# Patient Record
Sex: Female | Born: 1990 | Race: White | Hispanic: No | Marital: Married
Health system: Southern US, Community
[De-identification: ages and names within clinical notes are randomized; demographics above are authoritative.]

## PROBLEM LIST (undated history)

## (undated) DIAGNOSIS — F319 Bipolar disorder, unspecified: Secondary | ICD-10-CM

## (undated) DIAGNOSIS — F4310 Post-traumatic stress disorder, unspecified: Secondary | ICD-10-CM

## (undated) DIAGNOSIS — F32A Depression, unspecified: Secondary | ICD-10-CM

## (undated) DIAGNOSIS — F419 Anxiety disorder, unspecified: Secondary | ICD-10-CM

## (undated) DIAGNOSIS — E039 Hypothyroidism, unspecified: Secondary | ICD-10-CM

## (undated) HISTORY — PX: APPENDECTOMY: SHX54

---
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
## 2008-07-12 ENCOUNTER — Emergency Department (HOSPITAL_COMMUNITY): Admission: EM | Admit: 2008-07-12 | Discharge: 2008-07-12 | Payer: Self-pay | Admitting: Emergency Medicine

## 2008-07-19 ENCOUNTER — Emergency Department (HOSPITAL_COMMUNITY): Admission: EM | Admit: 2008-07-19 | Discharge: 2008-07-19 | Payer: Self-pay | Admitting: Emergency Medicine

## 2009-03-08 ENCOUNTER — Ambulatory Visit: Payer: Self-pay | Admitting: Psychiatry

## 2009-03-08 ENCOUNTER — Emergency Department (HOSPITAL_COMMUNITY): Admission: EM | Admit: 2009-03-08 | Discharge: 2009-03-08 | Payer: Self-pay | Admitting: Emergency Medicine

## 2009-03-08 ENCOUNTER — Inpatient Hospital Stay (HOSPITAL_COMMUNITY): Admission: AD | Admit: 2009-03-08 | Discharge: 2009-03-11 | Payer: Self-pay | Admitting: Psychiatry

## 2009-07-13 ENCOUNTER — Emergency Department (HOSPITAL_COMMUNITY): Admission: EM | Admit: 2009-07-13 | Discharge: 2009-07-14 | Payer: Self-pay | Admitting: Emergency Medicine

## 2010-03-09 IMAGING — CT CT ABDOMEN W/O CM
2 of 4 series · 17 of 46 positions shown, 19 images · non-contrast
Comparison: 12/15/2005.

CT ABDOMEN

CLINICAL DATA: Severe right lower quadrant pain.  History of prior
appendectomy.

CT ABDOMEN AND PELVIS WITHOUT CONTRAST
TECHNIQUE: Multidetector CT imaging of the abdomen and pelvis was
performed following the standard protocol without intravenous
contrast.

[Series 2: a/p w/o 5.0 b31f st · axial · non-contrast · 0.64mm/px · z∈[+880,+1265]mm · 14 of 85 slices shown, 16 images]
[im 4/85  soft-tissue]
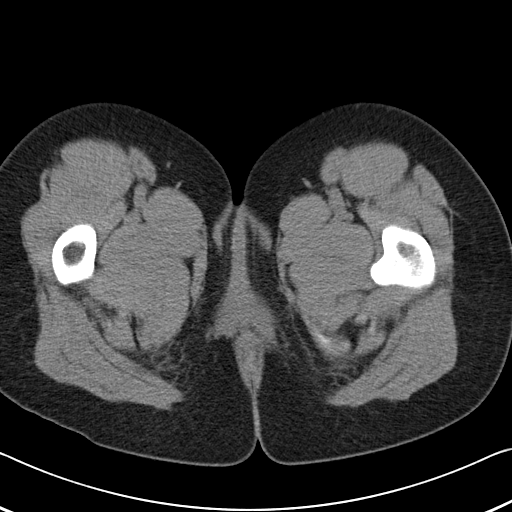
[im 4/85  bone]
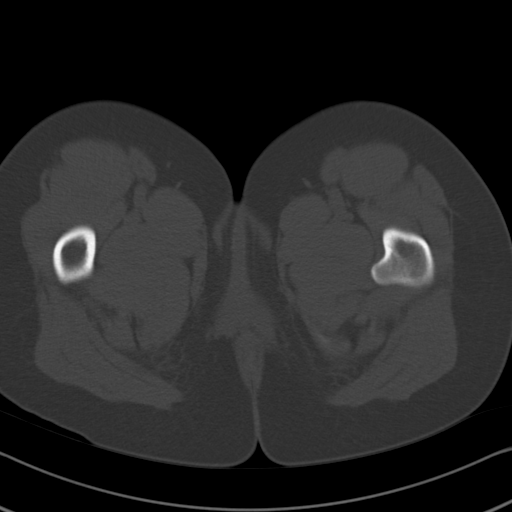
[im 10/85  soft-tissue]
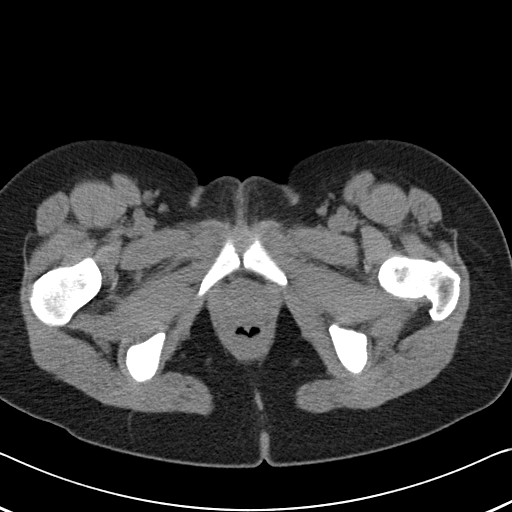
[im 17/85  soft-tissue]
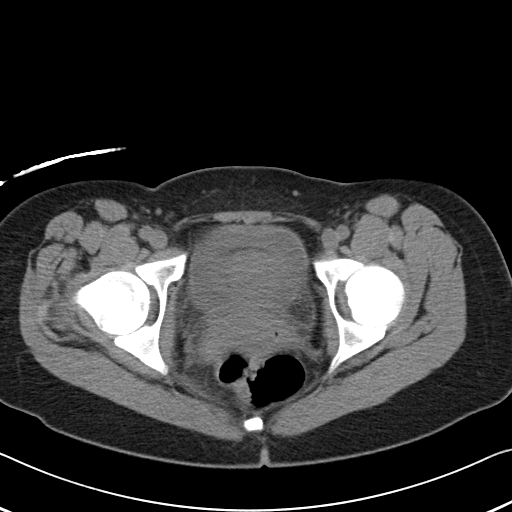
[im 23/85  soft-tissue]
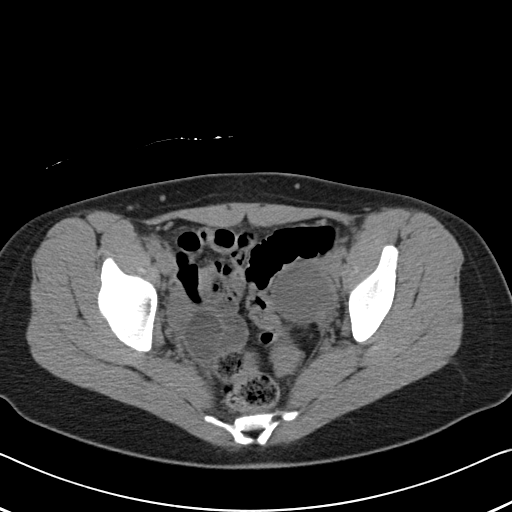
[im 30/85  soft-tissue]
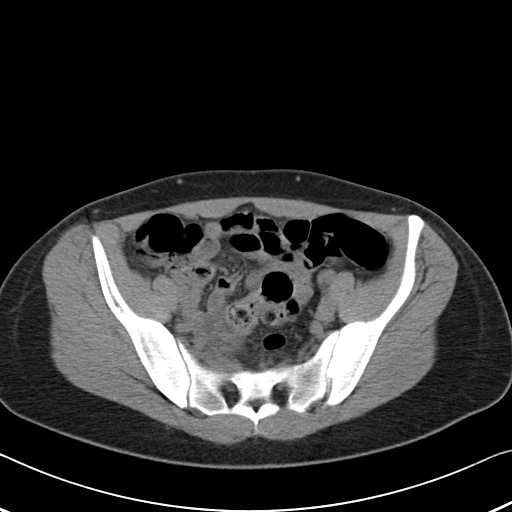
[im 33/85  soft-tissue]
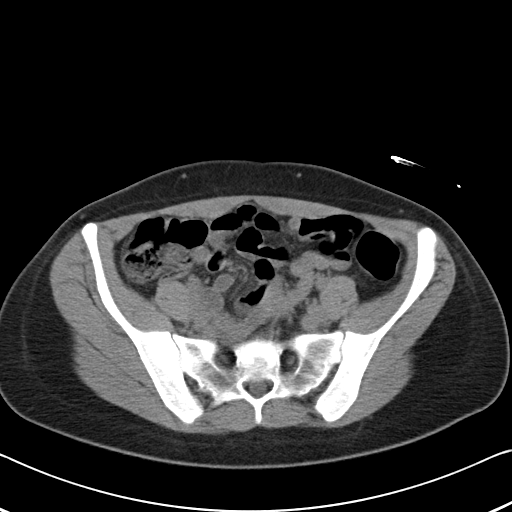
[im 39/85  soft-tissue]
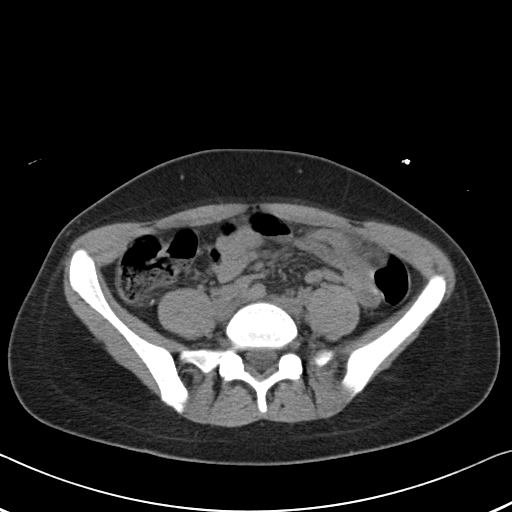
[im 46/85  soft-tissue]
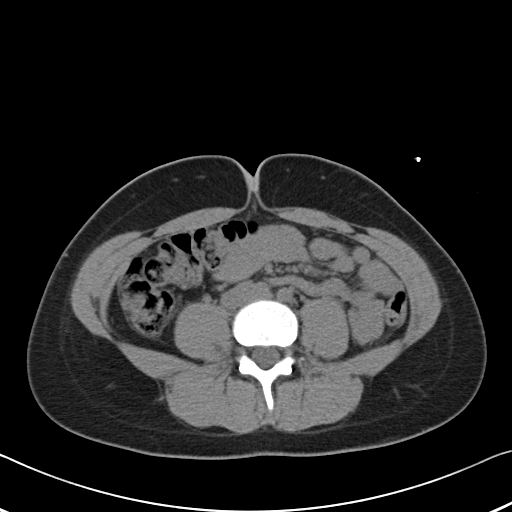
[im 52/85  soft-tissue]
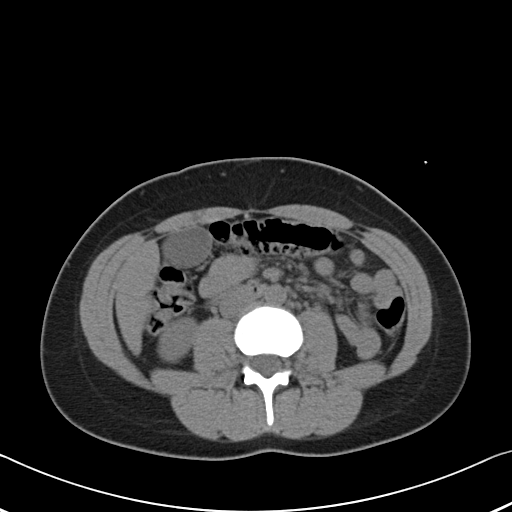
[im 52/85  bone]
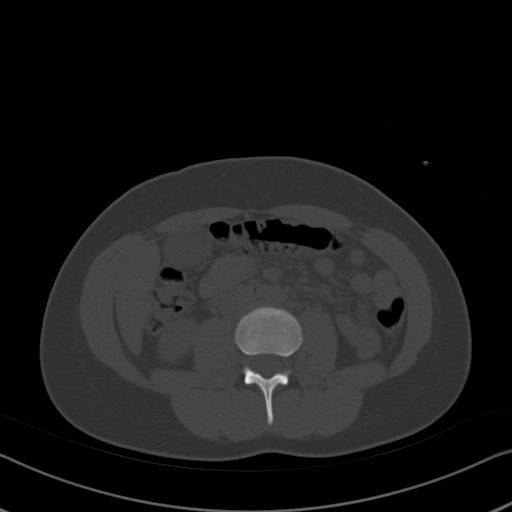
[im 55/85  soft-tissue]
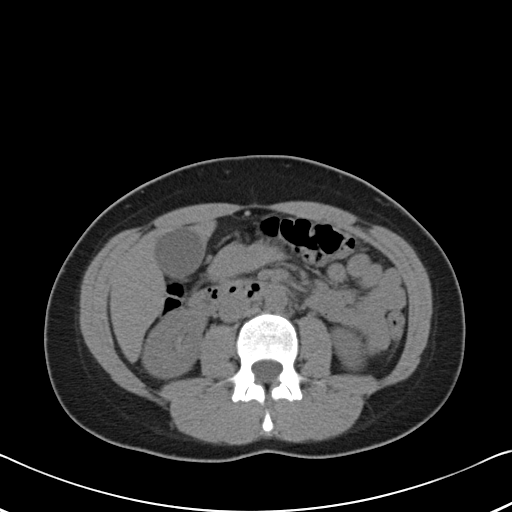
[im 62/85  soft-tissue]
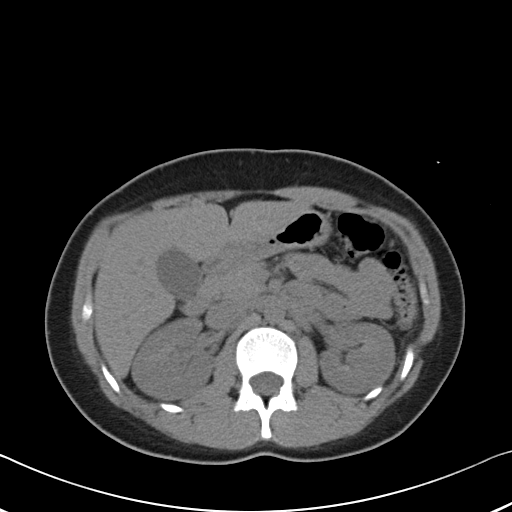
[im 68/85  soft-tissue]
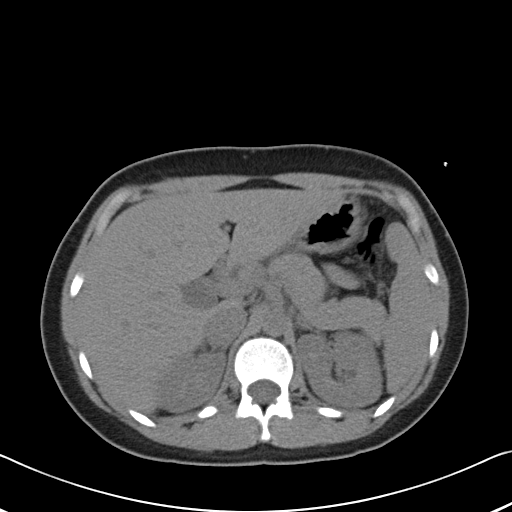
[im 75/85  soft-tissue]
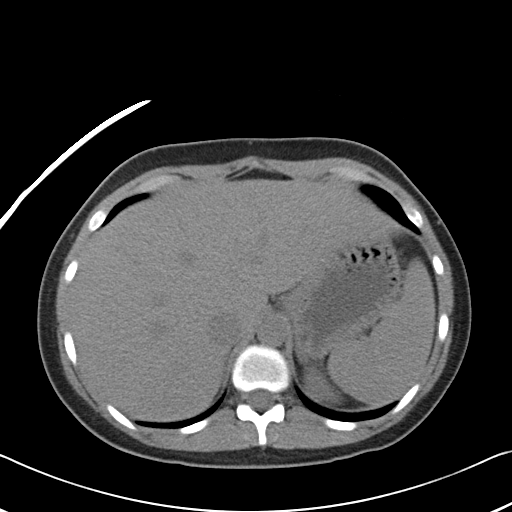
[im 81/85  soft-tissue]
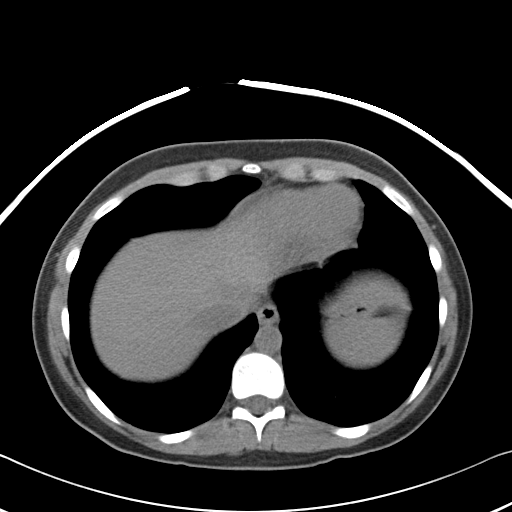

[Series 602: coronal · coronal · 0.83mm/px · 3 of 62 slices shown]
[im 21/62  soft-tissue]
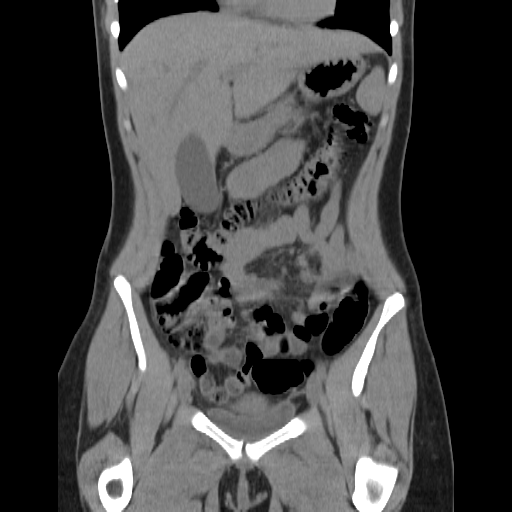
[im 28/62  soft-tissue]
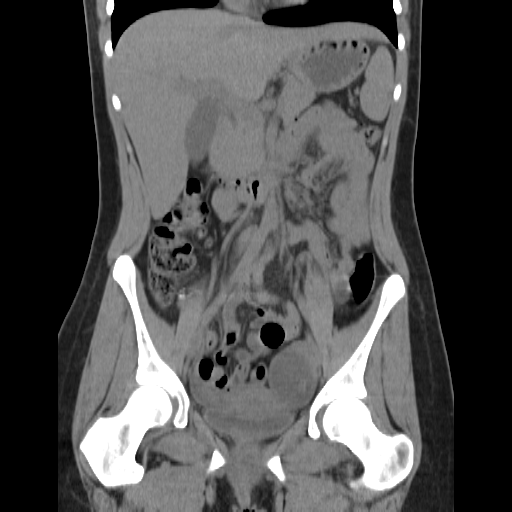
[im 34/62  soft-tissue]
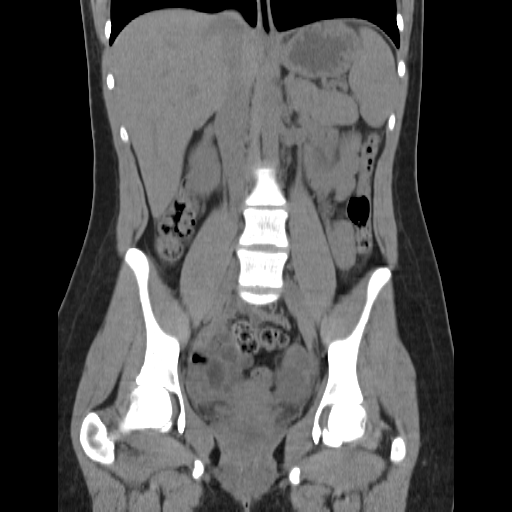

[17 of 46 positions shown; findings below may reference images not displayed]

FINDINGS: The liver and spleen have normal uninfused appearance.
The stomach, duodenum, pancreas, gallbladder, adrenal glands, and
kidneys have normal imaging features.  Specifically, there is no
evidence for renal calculi.  No secondary changes seen in either
kidney.

No abdominal lymphadenopathy.  No intraperitoneal free fluid.
IMPRESSION: Unremarkable CT scan of the abdomen without contrast.

CT PELVIS
FINDINGS: There is no evidence for intraperitoneal free fluid.
The bladder is unremarkable.  No pelvic sidewall lymphadenopathy.
Terminal ileum is normal.  Nonvisualization of the appendix is
consistent with the reported history of prior appendectomy.

4.0 x 4.2 cm cystic process is identified in the left adnexal
space.  In the right adnexal space, there is a serpiginous fluid
filled structure.  This could possibly be fluid filled small bowel,
but I cannot identify other small bowel loops with this amount of
fluid.  A dilated right fallopian tube would be another
consideration, especially in light of the patient's right lower
quadrant pain.  The uterus is unremarkable by CT.

Bone windows reveal no worrisome lytic or sclerotic osseous
lesions.
IMPRESSION: 4.2 cm cystic process in the left adnexal space is likely related
to the left ovary.  Pelvic ultrasound would be helpful to further
evaluate.

Fluid-filled serpiginous structure in the right adnexal space is
felt to be more likely related to the fallopian tube than small
bowel, as described above.  Hydrosalpinx or pyosalpinx could have
this appearance.  Pelvic ultrasound may be confirmatory.

## 2010-03-09 IMAGING — US US PELVIS COMPLETE MODIFY
1 series · 13 of 25 positions shown · non-contrast
Comparison: CT scan from earlier today.

CLINICAL DATA: CT scan earlier today showed a complex left adnexal
cystic lesion with possible right hydrosalpinx.

TRANSABDOMINAL AND TRANSVAGINAL ULTRASOUND OF PELVIS
TECHNIQUE: Both transabdominal and transvaginal ultrasound
examinations of the pelvis were performed including evaluation of
the uterus, ovaries, adnexal regions, and pelvic cul-de-sac.

[Series 1: us pelvis complete modify · 0.22mm/px · 13 of 87 slices shown]
[im 1/87]
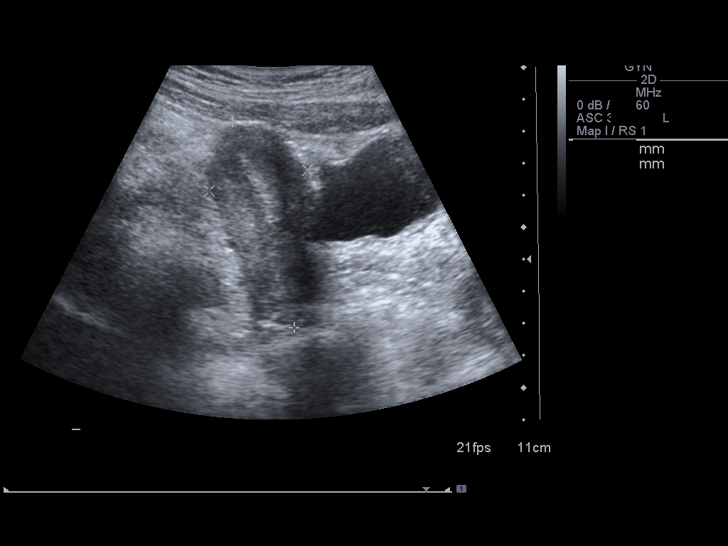
[im 8/87]
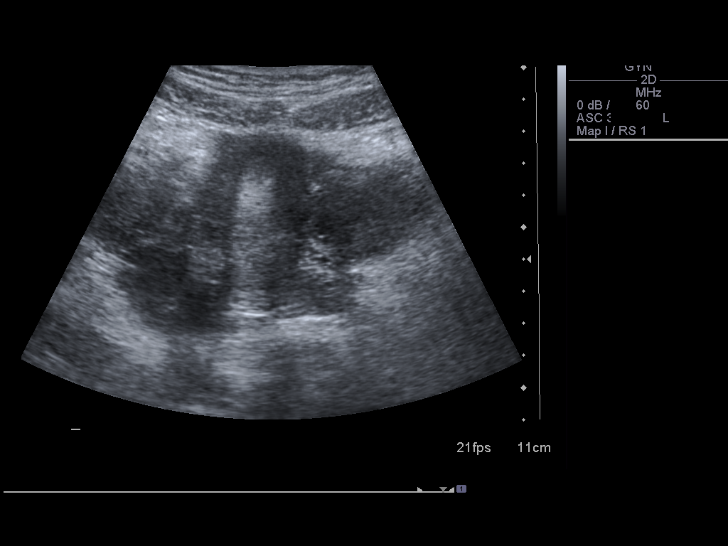
[im 15/87]
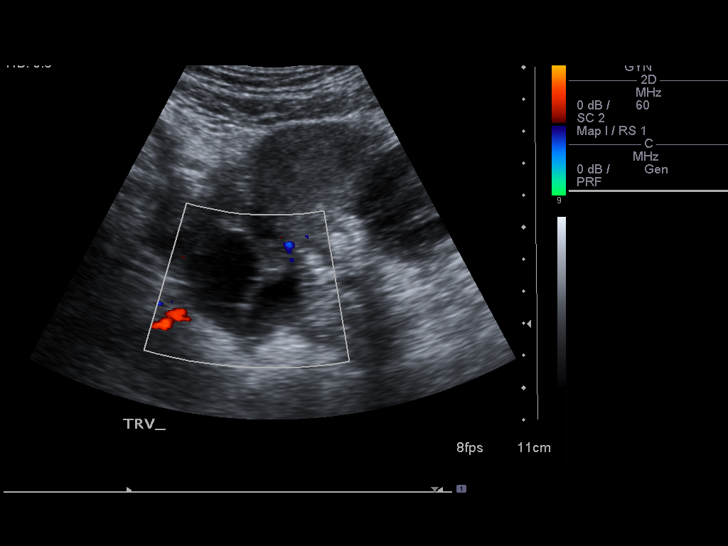
[im 22/87]
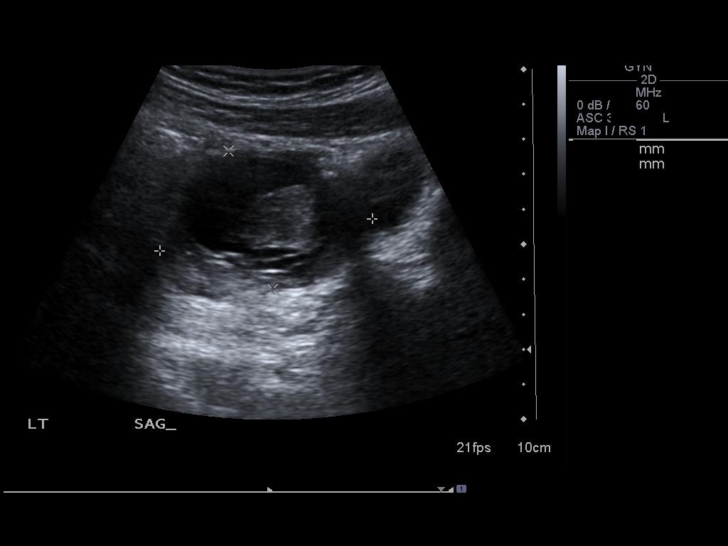
[im 29/87]
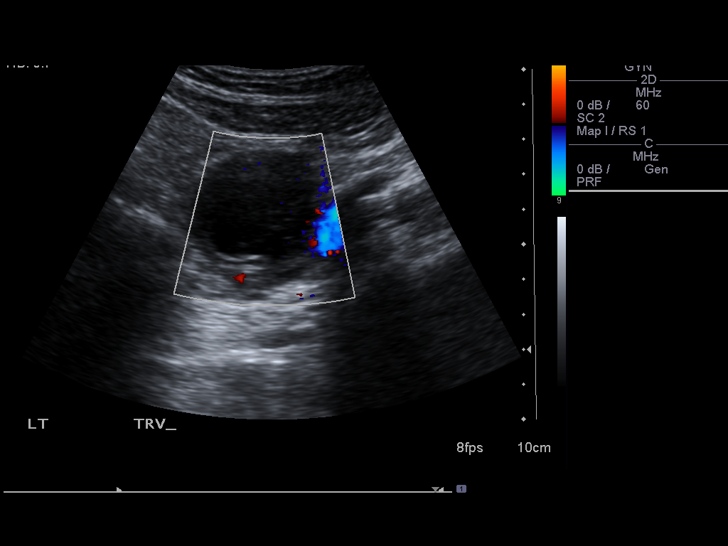
[im 36/87]
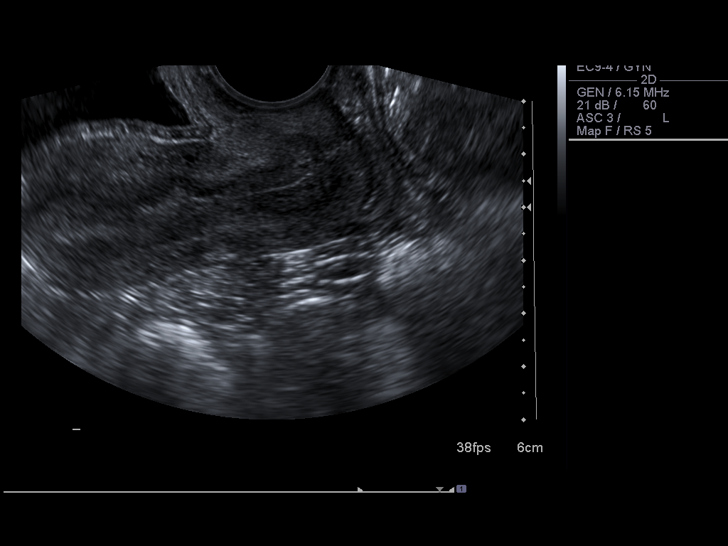
[im 44/87]
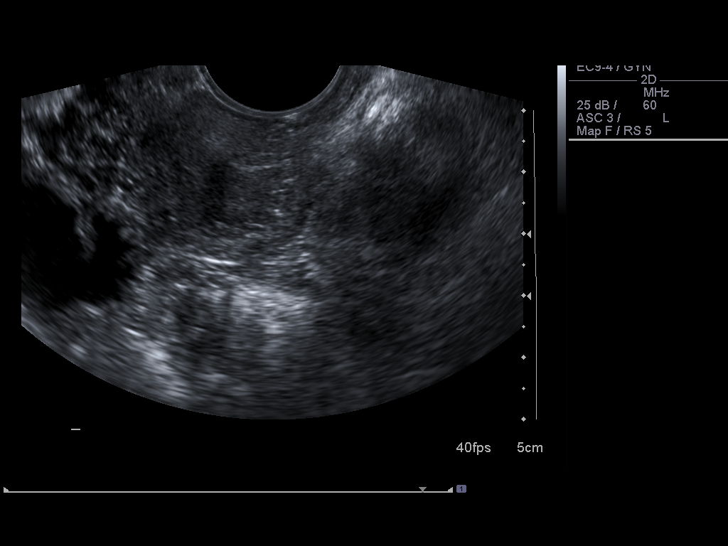
[im 51/87]
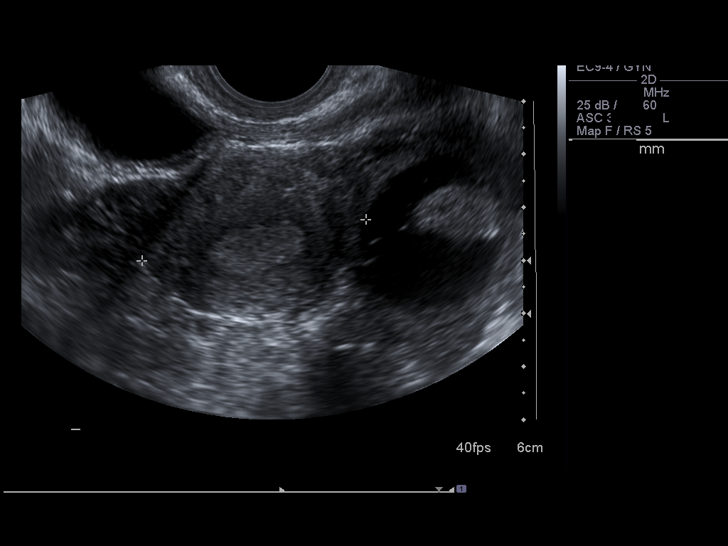
[im 58/87]
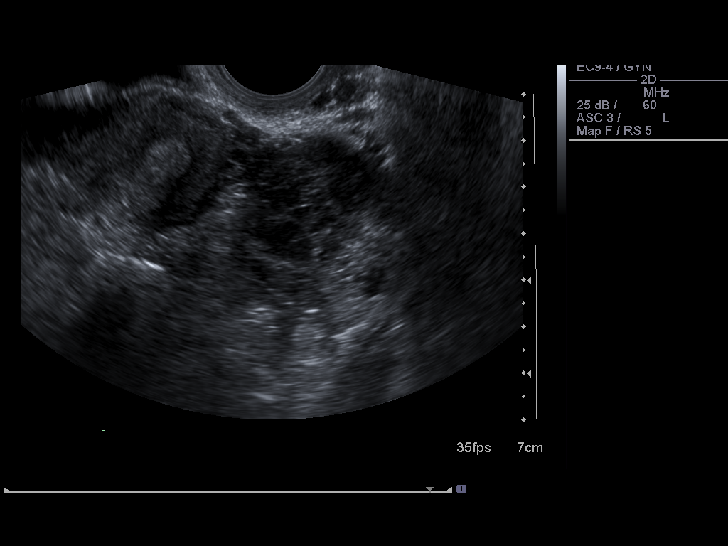
[im 65/87]
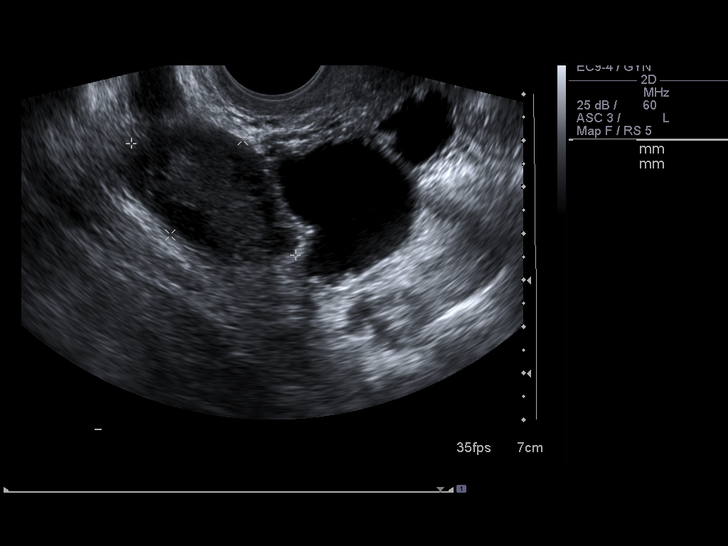
[im 72/87]
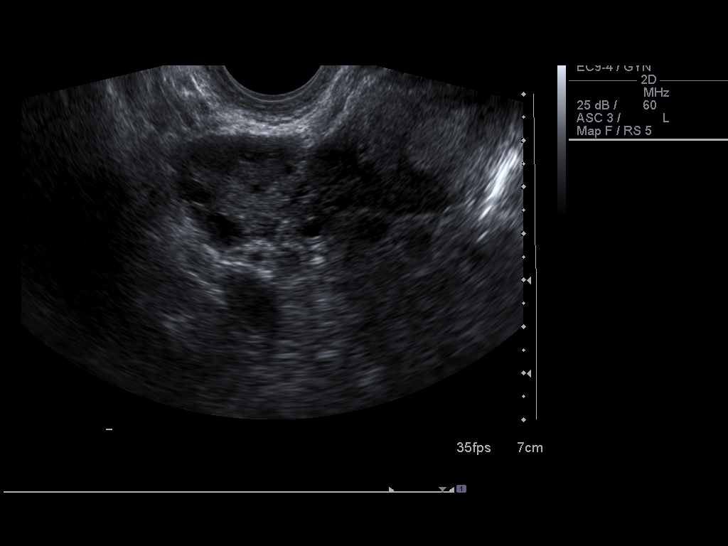
[im 79/87]
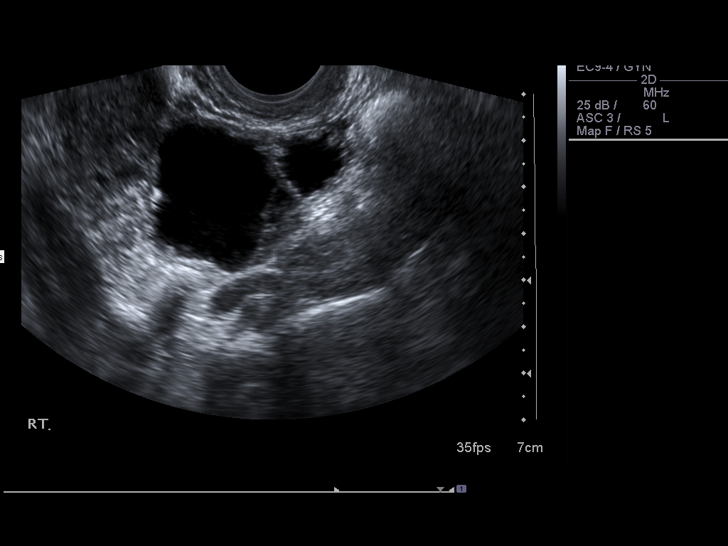
[im 87/87]
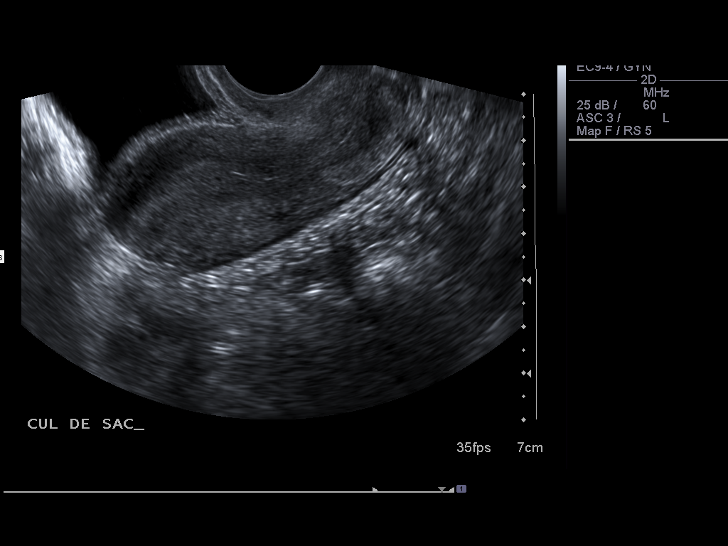

[13 of 25 positions shown; findings below may reference images not displayed]

FINDINGS: The uterus measures 6.7 x 3.1 x 4.4 cm.  Myometrial echotexture is
homogeneous.  No evidence for fibroids.  Endometrial stripe
thickness is
9. mm.

The right ovary measures 4.3 x 2.6 x 2.8 cm.  The left ovary is not
identified.  The patient has a complex 4.2 x 3.5 x 3.8 cm cystic
mass in the left adnexal space.

In the right adnexal space is a fluid filled tubular structure.
This could be a fluid-filled small bowel loop or fallopian tube.
The fluid within this structure is simple.
IMPRESSION: Complex cystic and solid lesion in the left adnexal space is
probably a hemorrhagic cyst in the left ovary.  Follow up
ultrasound in 6 weeks is recommended to reevaluate.

Normal right ovary with an adjacent fluid-filled tubular structure.
This is probably a dilated fallopian tube consistent with
hydrosalpinx.  I cannot completely excluded, however, that this
represents a focal, mildly distended small bowel loop.  This area
could also be reassessed at the time of follow-up ultrasound.

## 2011-02-13 LAB — COMPREHENSIVE METABOLIC PANEL
ALT: 19 U/L (ref 0–35)
AST: 19 U/L (ref 0–37)
Albumin: 3.7 g/dL (ref 3.5–5.2)
Alkaline Phosphatase: 45 U/L (ref 39–117)
BUN: 7 mg/dL (ref 6–23)
CO2: 23 mEq/L (ref 19–32)
Calcium: 8.6 mg/dL (ref 8.4–10.5)
Chloride: 111 mEq/L (ref 96–112)
Creatinine, Ser: 0.45 mg/dL (ref 0.4–1.2)
GFR calc Af Amer: >60 mL/min (ref >60)
GFR calc non Af Amer: >60 mL/min (ref >60)
Glucose, Bld: 89 mg/dL (ref 70–99)
Potassium: 3.4 mEq/L — ABNORMAL LOW (ref 3.5–5.1)
Sodium: 141 mEq/L (ref 135–145)
Total Bilirubin: 0.7 mg/dL (ref 0.3–1.2)
Total Protein: 6.4 g/dL (ref 6.0–8.3)

## 2011-02-13 LAB — URINALYSIS, ROUTINE W REFLEX MICROSCOPIC
Bilirubin Urine: NEGATIVE
Glucose, UA: NEGATIVE mg/dL
Ketones, ur: NEGATIVE mg/dL
Leukocytes, UA: NEGATIVE
Nitrite: NEGATIVE
Protein, ur: NEGATIVE mg/dL
Specific Gravity, Urine: 1.016 (ref 1.005–1.030)
Urobilinogen, UA: 0.2 mg/dL (ref 0.0–1.0)
pH: 6.5 (ref 5.0–8.0)

## 2011-02-13 LAB — GC/CHLAMYDIA PROBE AMP, GENITAL
Chlamydia, DNA Probe: NEGATIVE
GC Probe Amp, Genital: NEGATIVE

## 2011-02-13 LAB — CBC
HCT: 34 % — ABNORMAL LOW (ref 36.0–46.0)
Hemoglobin: 11.8 g/dL — ABNORMAL LOW (ref 12.0–15.0)
MCHC: 34.7 g/dL (ref 30.0–36.0)
MCV: 90.7 fL (ref 78.0–100.0)
Platelets: 232 10*3/uL (ref 150–400)
RBC: 3.75 MIL/uL — ABNORMAL LOW (ref 3.87–5.11)
RDW: 13 % (ref 11.5–15.5)
WBC: 9.6 10*3/uL (ref 4.0–10.5)

## 2011-02-13 LAB — WET PREP, GENITAL
Trich, Wet Prep: NONE SEEN
Yeast Wet Prep HPF POC: NONE SEEN

## 2011-02-13 LAB — DIFFERENTIAL
Basophils Absolute: 0 10*3/uL (ref 0.0–0.1)
Basophils Relative: 1 % (ref 0–1)
Eosinophils Absolute: 0.3 10*3/uL (ref 0.0–0.7)
Eosinophils Relative: 3 % (ref 0–5)
Lymphocytes Relative: 35 % (ref 12–46)
Lymphs Abs: 3.4 10*3/uL (ref 0.7–4.0)
Monocytes Absolute: 0.6 10*3/uL (ref 0.1–1.0)
Monocytes Relative: 7 % (ref 3–12)
Neutro Abs: 5.3 10*3/uL (ref 1.7–7.7)
Neutrophils Relative %: 55 % (ref 43–77)

## 2011-02-13 LAB — URINE MICROSCOPIC-ADD ON

## 2011-02-13 LAB — PREGNANCY, URINE: Preg Test, Ur: NEGATIVE

## 2011-02-18 LAB — CBC
HCT: 39.3 % (ref 36.0–46.0)
Hemoglobin: 13.6 g/dL (ref 12.0–15.0)
MCHC: 34.6 g/dL (ref 30.0–36.0)
MCV: 86.3 fL (ref 78.0–100.0)
Platelets: 214 10*3/uL (ref 150–400)
RBC: 4.56 MIL/uL (ref 3.87–5.11)
RDW: 13.1 % (ref 11.5–15.5)
WBC: 8.1 10*3/uL (ref 4.0–10.5)

## 2011-02-18 LAB — URINALYSIS, ROUTINE W REFLEX MICROSCOPIC
Glucose, UA: NEGATIVE mg/dL
Hgb urine dipstick: NEGATIVE
Ketones, ur: >80 mg/dL — AB
Nitrite: NEGATIVE
Protein, ur: 30 mg/dL — AB
Specific Gravity, Urine: 1.029 (ref 1.005–1.030)
Urobilinogen, UA: 1 mg/dL (ref 0.0–1.0)
pH: 6 (ref 5.0–8.0)

## 2011-02-18 LAB — POCT I-STAT, CHEM 8
BUN: 12 mg/dL (ref 6–23)
Calcium, Ion: 1.17 mmol/L (ref 1.12–1.32)
Chloride: 101 mEq/L (ref 96–112)
Creatinine, Ser: 0.8 mg/dL (ref 0.4–1.2)
Glucose, Bld: 119 mg/dL — ABNORMAL HIGH (ref 70–99)
HCT: 42 % (ref 36.0–46.0)
Hemoglobin: 14.3 g/dL (ref 12.0–15.0)
Potassium: 3.1 mEq/L — ABNORMAL LOW (ref 3.5–5.1)
Sodium: 137 mEq/L (ref 135–145)
TCO2: 24 mmol/L (ref 0–100)

## 2011-02-18 LAB — URINE CULTURE
Colony Count: NO GROWTH
Culture: NO GROWTH

## 2011-02-18 LAB — DIFFERENTIAL
Basophils Absolute: 0 10*3/uL (ref 0.0–0.1)
Basophils Relative: 1 % (ref 0–1)
Eosinophils Absolute: 0.2 10*3/uL (ref 0.0–0.7)
Eosinophils Relative: 2 % (ref 0–5)
Lymphocytes Relative: 44 % (ref 12–46)
Lymphs Abs: 3.6 10*3/uL (ref 0.7–4.0)
Monocytes Absolute: 0.7 10*3/uL (ref 0.1–1.0)
Monocytes Relative: 9 % (ref 3–12)
Neutro Abs: 3.6 10*3/uL (ref 1.7–7.7)
Neutrophils Relative %: 45 % (ref 43–77)

## 2011-02-18 LAB — RAPID URINE DRUG SCREEN, HOSP PERFORMED
Amphetamines: NOT DETECTED
Barbiturates: NOT DETECTED
Benzodiazepines: POSITIVE — AB
Cocaine: POSITIVE — AB
Opiates: NOT DETECTED
Tetrahydrocannabinol: POSITIVE — AB

## 2011-02-18 LAB — URINE MICROSCOPIC-ADD ON

## 2011-02-18 LAB — POCT PREGNANCY, URINE: Preg Test, Ur: NEGATIVE

## 2011-02-18 LAB — ETHANOL: Alcohol, Ethyl (B): <5 mg/dL (ref 0–10)
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
## 2011-03-27 NOTE — Op Note (Signed)
NAMEBRITTEN, Gina [name]             ACCOUNT NO.:  [id number]   MEDICAL RECORD NO.:  [id number]          PATIENT TYPE:  INP   LOCATION:  6116                         FACILITY:  [facility]   PHYSICIAN:  Leonia Corona, M.D.  DATE OF BIRTH:  05-Jul-1991   DATE OF PROCEDURE:  10/09/2005  DATE OF DISCHARGE:                                 OPERATIVE REPORT   PREOPERATIVE DIAGNOSIS:  Resolved appendicular abscess for interval  appendectomy.   POSTOPERATIVE DIAGNOSIS:  Resolved appendicular abscess for interval  appendectomy.   PROCEDURE PERFORMED:  Interval laparoscopic appendectomy.   ANESTHESIA:  General endotracheal anesthesia.   SURGEON:  Leonia Corona, M.D.   ASSISTANTDonnella Bi D. Pendse, M.D.   ANESTHESIA:  General endotracheal anesthesia.   INDICATIONS FOR PROCEDURE:  This 20 year old female child was evaluated for  severe abdominal pain about eight weeks ago in the emergency room diagnosed  as appendicular abscess forming appendicular mass in the right lower  quadrant, clinically treated conservatively nonoperatively using IV  antibiotics for three weeks.  She responded very well and now the patient is  brought in for interval laparoscopic appendectomy, hence the indication for  the procedure.   PROCEDURE IN DETAIL:  The patient is brought in the operating room, placed  supine on the operating table, general endotracheal anesthesia is given.  A  Foley catheter was placed to deflate the bladder temporarily for the  procedure.  The abdomen was cleaned, prepped and draped from nipples to  pelvis and from one side to the other side.  The initial incision was made  infraumbilically in a curvilinear fashion for the placement of a 10/12  Hasson cannula.  The incision was made with the knife, deepened through the  subcutaneous tissue using a hemostat until the fascia was reached.  The  fascia was grasped with two clamps and incised inbetween.  An opening into  the fascia was made  and a right index finger was introduced into the  peritoneal cavity to sweep it around to break any adhesions.  After getting  clear entry into the peritoneal cavity, a stay suture using 0 Vicryl was  placed on the divided fascia.  The Hasson cannula was introduced into the  peritoneal cavity under direct vision and the 0 Vicryl was wrapped around it  to hold it in position.  The CO2 insufflation was done to the desired  pressure.  The camera was introduced through this port to inspect the  peritoneal cavity.  No obvious abnormality was noted.  The right lower  quadrant showed the cecum and the appendix.  We put the second cannula in  the right upper quadrant for which a 5 mm incision was made and a 5 mm port  was introduced under direct vision of the camera from within the peritoneal  cavity.  The third port was in the left lower quadrant under direct vision  of the camera from which a 5 mm incision was made and a 5 mm port was placed  under direct vision of the camera from within the peritoneal cavity.  Working through these 5 mm ports, we introduced  a grasper through the right  upper quadrant port and grasped the appendix.  The patient was placed in the  Trendelenburg position and tiled to the left side to expose the contents of  the right lower quadrant.  The appendix was held up with a grasper upward.  The mesoappendix was thin and fibrous, it was taken down without any major  vascular attachments.  Working through the left lower quadrant port and with  the help of a dissector, we were able to create a window between the base of  the appendix and the appendicular mesentery carrying the blood vessels.  At  this point, after separating the vascular attachments and the appendicular  base of the appendix, the 5 mm camera was used which was introduced through  the left lower quadrant port and the GIA stapler through the umbilical port  was introduced and the vascular pedicle was clamped,  after insuring that it  was not catching any bowel, it was fired and the vascular pedicle was  divided.  At this point, we reloaded the 10 mm GIA stapler which was  reintroduced through the umbilical port and the base of the appendix was  grasped and the stapler was fired which divided the appendix.  The appendix  was placed in the peritoneal cavity.  The peritoneal cavity was inspected.  At this point, an endobag was introduced through the umbilical port, the  appendix divided and separated, the appendix was fed into the bag, the bag  was closed and removed through the umbilical port along with the port.  After removing the bag with the appendix, the umbilical port was  reintroduced, the CO2 insufflation was recreated, and the abdominal cavity  was reinspected.  There was some oozing from the staple line on the vascular  pedicle which was observed after thorough irrigation with normal saline  which was suctioned out until it was clear.  We waited about 10-15 minutes  of observation on the staple line and felt no necessity to cauterize or  clamp these blood vessels, there was small oozing which gradually slowed  down and finally stopped.  After irrigating the right lower quadrant, we  irrigated the pelvic organ, both the tubes and ovaries appeared normal, the  uterus appeared normal, all fluid was suctioned out from the peritoneal  cavity, completely.  The patient was placed in a neutral position and the  ports were withdrawn under direct vision of the camera to watch the  abdominal wall for any bleeding inside.  No bleeding was noted.  The ports  were removed.  The stay sutures in the umbilical port were then tied which  closed the fascia completely without any additional stitch.  Before tying  this stitch, complete evacuation of CO2 was done to deflate the abdomen.  The 5 mm ports were closed only at the skin using 5-0 Vicryl subcuticular stitch.  The umbilical incision skin was also  closed with 5-0 Vicryl  subcuticular stitch.  The wound was cleaned and dried.  Steri-Strips were  applied over the incision and covered with sterile gauze and Tegaderm  dressing.  The patient tolerated the procedure very well which was smooth  and uneventful.  The patient was later extubated and transferred to the  recovery room in good, stable condition.      Leonia Corona, M.D.  Electronically Signed     SF/MEDQ  D:  10/09/2005  T:  10/09/2005  Job:  [id number]

## 2011-03-27 NOTE — Discharge Summary (Signed)
NAMECHERICE, GLENNIE             ACCOUNT NO.:  [id number]   MEDICAL RECORD NO.:  [id number]          PATIENT TYPE:  INP   LOCATION:  6116                         FACILITY:  [facility]   PHYSICIAN:  Leonia Corona, M.D.  DATE OF BIRTH:  1991/04/29   DATE OF ADMISSION:  10/09/2005  DATE OF DISCHARGE:  10/10/2005                                 DISCHARGE SUMMARY   DISCHARGE DIAGNOSES:  Scheduled status post laparoscopic appendectomy.   DISCHARGE MEDICATIONS:  Percocet 5/325, take one tablet p.o. q.6h. for pain,  #10.   HISTORY OF PRESENT ILLNESS:  The patient is a 20 year old white female who  had a history of abdominal pain since September.  A workup was performed by  her primary M.D., with continued abdominal pain.  A CT was obtained which  revealed an appendicular abscess without perforation.  The patient was  placed on IV antibiotics x10 days and then p.o. antibiotics x10 days.  The  patient continued to have abdominal pain, and surgery was scheduled for a  laparoscopic appendectomy on October 09, 2005.  The patient had a resolved  appendicular abscess.   HOSPITAL COURSE:  The patient had a laparoscopic appendectomy performed on  October 09, 2005 and was then transferred to the pediatric inpatient  service.  The patient tolerated the procedure well.  She advanced her diet,  and her pain was well-controlled on IV morphine and then changed to Percocet  5/325 q.6h. p.r.n. pain.  The patient was stable and ready for discharge  home on October 10, 2005.   FINAL DIAGNOSIS:  Resolved appendicular abscess status post laparoscopic  appendectomy.   FOLLOW UP:  The patient is to follow up with Dr. Leeanne Mannan as scheduled in 10  days on October 20, 2005 at 2:15 p.m.   DISCHARGE DATA:  Discharge weight 48 kg.   CONDITION ON DISCHARGE:  Stable.   PRIMARY CARE PHYSICIAN:  Elon Jester, M.D. at [facility].     ______________________________  Pediatrics Resident      Leonia Corona, M.D.  Electronically Signed    PR/MEDQ  D:  10/10/2005  T:  10/11/2005  Job:  [id number]   cc:   Leonia Corona, M.D.  Fax: [phone]   Elon Jester, M.D.  Fax: [phone]

## 2011-03-27 NOTE — Discharge Summary (Signed)
[name], [name]             ACCOUNT NO.:  [id number]   MEDICAL RECORD NO.:  [id number]          PATIENT TYPE:  OIB   LOCATION:  6124                         FACILITY:  [facility]   PHYSICIAN:  Pediatrics Resident    DATE OF BIRTH:  Dec 09, 1990   DATE OF ADMISSION:  12/15/2005  DATE OF DISCHARGE:  12/16/2005                                 DISCHARGE SUMMARY   HOSPITAL COURSE:  Patient admitted on December 15, 2005, for two week  history of abdominal pain.  She does have a history of a periappendicular  abscess treated with three weeks of IV antibiotics and subsequent  laparoscopic appendectomy.  Pain resolved following appendectomy but  returned two weeks ago.  Patient was admitted by surgery for further work-up  of abdominal pain.  CT of the abdomen showed no acute or active inflammatory  process.  Chest x-ray was normal.  Pediatric surgery felt it was no longer a  surgical issue and she was transferred to the pediatrics teaching teams.  Labs were concerning for a low albumin of 2.8, although all other labs were  normal.  She received a HIV, hepatitis C, hepatitis B tests which were found  to be normal.  She received a pelvic exam and cultures were sent for GC and  Chlamydia.  She did have cervical motion tenderness and was treated with  doxycycline and cefixime one dose of each for possible PID.  GC and  Chlamydia cultures returned as positive.   DIAGNOSIS:  Pelvic inflammatory disease.   MEDICATIONS:  None.   CONDITION ON DISCHARGE:  Improved and stable.   DISCHARGE INSTRUCTIONS:  Follow up with Dr. Carmon Ginsberg at [facility]  on Friday, December 18, 2005, at 1 p.m.  Dr. Carmon Ginsberg has been contacted and  made aware of the recent lab results of positive Chlamydia and GC culture.           ______________________________  Pediatrics Resident     PR/MEDQ  D:  12/17/2005  T:  12/18/2005  Job:  [id number]
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
## 2021-06-23 ENCOUNTER — Ambulatory Visit (INDEPENDENT_AMBULATORY_CARE_PROVIDER_SITE_OTHER): Payer: No Payment, Other | Admitting: Clinical

## 2021-06-23 ENCOUNTER — Other Ambulatory Visit: Payer: Self-pay

## 2021-06-23 DIAGNOSIS — F1121 Opioid dependence, in remission: Secondary | ICD-10-CM

## 2021-06-23 DIAGNOSIS — F1521 Other stimulant dependence, in remission: Secondary | ICD-10-CM

## 2021-06-23 DIAGNOSIS — F1421 Cocaine dependence, in remission: Secondary | ICD-10-CM

## 2021-06-23 DIAGNOSIS — F331 Major depressive disorder, recurrent, moderate: Secondary | ICD-10-CM | POA: Diagnosis not present
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
## 2021-07-18 ENCOUNTER — Ambulatory Visit (HOSPITAL_COMMUNITY): Payer: No Payment, Other | Admitting: Student in an Organized Health Care Education/Training Program

## 2021-07-25 ENCOUNTER — Ambulatory Visit (HOSPITAL_COMMUNITY): Payer: No Payment, Other | Admitting: Physician Assistant

## 2021-08-12 ENCOUNTER — Ambulatory Visit (HOSPITAL_COMMUNITY): Payer: Self-pay | Admitting: Clinical

## 2021-08-21 ENCOUNTER — Ambulatory Visit (HOSPITAL_COMMUNITY): Payer: Self-pay | Admitting: Physician Assistant

## 2021-09-02 ENCOUNTER — Ambulatory Visit (HOSPITAL_COMMUNITY): Payer: Self-pay | Admitting: Clinical

## 2021-09-02 ENCOUNTER — Other Ambulatory Visit: Payer: Self-pay

## 2021-09-02 ENCOUNTER — Ambulatory Visit (INDEPENDENT_AMBULATORY_CARE_PROVIDER_SITE_OTHER): Payer: No Payment, Other | Admitting: Clinical

## 2021-09-02 DIAGNOSIS — F331 Major depressive disorder, recurrent, moderate: Secondary | ICD-10-CM
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
## 2021-10-13 ENCOUNTER — Ambulatory Visit (HOSPITAL_COMMUNITY): Payer: No Payment, Other | Admitting: Clinical

## 2021-10-30 ENCOUNTER — Ambulatory Visit (HOSPITAL_COMMUNITY): Payer: Self-pay | Admitting: Clinical

## 2024-06-14 ENCOUNTER — Emergency Department (HOSPITAL_COMMUNITY): Payer: MEDICAID

## 2024-06-14 ENCOUNTER — Encounter (HOSPITAL_COMMUNITY): Payer: Self-pay | Admitting: Emergency Medicine

## 2024-06-14 ENCOUNTER — Other Ambulatory Visit: Payer: Self-pay

## 2024-06-14 ENCOUNTER — Emergency Department (HOSPITAL_COMMUNITY)
Admission: EM | Admit: 2024-06-14 | Discharge: 2024-06-14 | Disposition: A | Discharge status: Home / Self Care | Payer: MEDICAID | Attending: Student | Admitting: Student

## 2024-06-14 DIAGNOSIS — Z23 Encounter for immunization: Secondary | ICD-10-CM | POA: Diagnosis not present

## 2024-06-14 DIAGNOSIS — S99912A Unspecified injury of left ankle, initial encounter: Secondary | ICD-10-CM | POA: Diagnosis present

## 2024-06-14 DIAGNOSIS — W1840XA Slipping, tripping and stumbling without falling, unspecified, initial encounter: Secondary | ICD-10-CM | POA: Insufficient documentation

## 2024-06-14 DIAGNOSIS — S93402A Sprain of unspecified ligament of left ankle, initial encounter: Secondary | ICD-10-CM | POA: Diagnosis not present

## 2024-06-14 HISTORY — DX: Bipolar disorder, unspecified: F31.9

## 2024-06-14 HISTORY — DX: Hypothyroidism, unspecified: E03.9

## 2024-06-14 HISTORY — DX: Anxiety disorder, unspecified: F41.9

## 2024-06-14 HISTORY — DX: Post-traumatic stress disorder, unspecified: F43.10

## 2024-06-14 HISTORY — DX: Depression, unspecified: F32.A

## 2024-06-14 MED ORDER — ACETAMINOPHEN 325 MG PO TABS
650.0000 mg | ORAL_TABLET | Freq: Once | ORAL | Status: AC
Start: 2024-06-14 — End: 2024-06-14
  Administered 2024-06-14: 650 mg via ORAL
  Filled 2024-06-14 (×2): qty 2

## 2024-06-14 MED ORDER — TETANUS-DIPHTH-ACELL PERTUSSIS 5-2.5-18.5 LF-MCG/0.5 IM SUSY
0.5000 mL | PREFILLED_SYRINGE | Freq: Once | INTRAMUSCULAR | Status: AC
  Administered 2024-06-14: 0.5 mL via INTRAMUSCULAR
  Filled 2024-06-14: qty 0.5

## 2024-06-14 MED ORDER — IBUPROFEN 800 MG PO TABS
800.0000 mg | ORAL_TABLET | Freq: Once | ORAL | Status: AC
Start: 2024-06-14 — End: 2024-06-14
  Administered 2024-06-14: 800 mg via ORAL
  Filled 2024-06-14: qty 1

## 2024-06-14 NOTE — ED Triage Notes (Signed)
 Pt c/o left foot/ankle pain after a fall tonight, denies LOC, hitting head, blood thinners; swelling noted

## 2024-06-14 NOTE — Discharge Instructions (Addendum)
 It was a pleasure taking part in your care.  As we discussed, the x-ray taken tonight showed no fracture.  You most likely sprained your ankle.  Please remain in the cam boot until seen by orthopedics, please utilize crutches.  You may take ibuprofen  or Tylenol  every 6 hours as needed for pain.  Utilize RICE protocol.  This stands for rest, ice, compression and elevation.  Please read attached guide concerning ankle sprain.  Follow-up with Dr. Elsa, orthopedics, for further management.  Return to the ED with any new symptoms.

## 2024-06-14 NOTE — ED Provider Notes (Signed)
 [place] EMERGENCY DEPARTMENT AT [facility] Provider Note   CSN: [id number] Arrival date & time: 06/14/24  0020     Patient presents with: Ankle Pain   Gina [name] is a 33 y.o. female who presents to the ED for evaluation of left ankle and foot pain.  Reports that she was stepping down the stairs this evening when she tripped and twisted her left ankle.  She denies hitting her head, losing consciousness, preceding chest pain or shortness of breath.  Reports that her symptoms have been persistent since event.  Denies history of surgical intervention of left ankle.  Denies any other concerns.  Denies medications prior to arrival.    Ankle Pain      Prior to Admission medications   Not on File    Allergies: Patient has no known allergies.    Review of Systems  Musculoskeletal:  Positive for arthralgias and myalgias.  All other systems reviewed and are negative.   Updated Vital Signs BP 123/81 (BP Location: Right Arm)   Pulse 84   Temp 98.4 F (36.9 C) (Oral)   Resp 17   Ht 5' 2 (1.575 m)   Wt 84.8 kg   LMP 05/25/2024 (Approximate)   SpO2 97%   BMI 34.20 kg/m   Physical Exam Vitals and nursing note reviewed.  Constitutional:      General: She is not in acute distress.    Appearance: She is well-developed.  HENT:     Head: Normocephalic and atraumatic.  Eyes:     Conjunctiva/sclera: Conjunctivae normal.  Cardiovascular:     Rate and Rhythm: Normal rate and regular rhythm.     Heart sounds: No murmur heard. Pulmonary:     Effort: Pulmonary effort is normal. No respiratory distress.     Breath sounds: Normal breath sounds.  Abdominal:     Palpations: Abdomen is soft.     Tenderness: There is no abdominal tenderness.  Musculoskeletal:        General: No swelling.     Cervical back: Neck supple.     Comments: No obvious swelling of patient left ankle or foot.  Reduced ROM secondary to pain.  No deformity.  2+ DP pulse.  Brisk cap refill.   Neurovascularly intact.  Reduced inversion, eversion secondary to pain.  Skin:    General: Skin is warm and dry.     Capillary Refill: Capillary refill takes less than 2 seconds.     Comments: Superficial abrasion dorsum left foot.  Neurological:     Mental Status: She is alert and oriented to person, place, and time. Mental status is at baseline.  Psychiatric:        Mood and Affect: Mood normal.     (all labs ordered are listed, but only abnormal results are displayed) Labs Reviewed - No data to display  EKG: None  Radiology: DG Ankle Complete Left Result Date: 06/14/2024 EXAM: 3 or more VIEW(S) XRAY OF THE LEFT ANKLE 06/14/2024 12:43:57 AM CLINICAL HISTORY: Fall/injury. Pt c/o left foot/ankle pain after a fall tonight, denies LOC, hitting head, blood thinners; swelling note. Laceration to anteriolateral aspect of ankle around 4th tarsal. COMPARISON: None available. FINDINGS: BONES AND JOINTS: No acute fracture. No focal osseous lesion. No joint dislocation. SOFT TISSUES: The soft tissues are unremarkable. IMPRESSION: 1. No acute osseous abnormality. Electronically signed by: Norman Gatlin MD 06/14/2024 12:53 AM EDT RP Workstation: [id number]     Procedures   Medications Ordered in the ED  Tdap (  BOOSTRIX) injection 0.5 mL (0.5 mLs Intramuscular Given 06/14/24 0219)  ibuprofen  (ADVIL ) tablet 800 mg (800 mg Oral Given 06/14/24 0218)  acetaminophen  (TYLENOL ) tablet 650 mg (650 mg Oral Given 06/14/24 0218)    Medical Decision Making Amount and/or Complexity of Data Reviewed Radiology: ordered.  Risk OTC drugs. Prescription drug management.   This is a 33 year old female who presents to the ED for evaluation of left-sided ankle pain.  On examination she has no obvious deformity.  She has no obvious swelling to her left ankle or left foot.  She has a brisk cap refill, 2+ DP pulse.  She is neurovascularly intact.  She has reduced eversion, inversion of her left ankle secondary to  pain.  Patient x-ray imaging collected shows no acute abnormality or fracture.  Patient placed in cam boot, tetanus updated due to abrasion on foot, given ibuprofen  and Tylenol .  Patient will be referred to orthopedics at this time.  She was educated on RICE protocol and she voiced understanding.  She all of her questions answered to satisfaction.  She is stable to discharge home at this time.  Will follow-up with orthopedics.    Final diagnoses:  Sprain of left ankle, unspecified ligament, initial encounter    ED Discharge Orders     None          Ruthell Lonni FALCON, PA-C 06/14/24 0240    Albertina Dixon, MD 06/20/24 [id number]

## 2024-06-14 NOTE — ED Notes (Signed)
 Patient d/c with home care instructions.
